# Patient Record
Sex: Male | Born: 1955 | Race: Black or African American | Hispanic: No | Marital: Single
Health system: Southern US, Community
[De-identification: ages and names within clinical notes are randomized; demographics above are authoritative.]

---

## 2004-02-23 ENCOUNTER — Emergency Department (HOSPITAL_COMMUNITY): Admission: EM | Admit: 2004-02-23 | Discharge: 2004-02-23 | Payer: Self-pay | Admitting: Emergency Medicine

## 2004-02-24 ENCOUNTER — Emergency Department (HOSPITAL_COMMUNITY): Admission: EM | Admit: 2004-02-24 | Discharge: 2004-02-24 | Payer: Self-pay | Admitting: Emergency Medicine

## 2004-08-15 ENCOUNTER — Emergency Department (HOSPITAL_COMMUNITY): Admission: EM | Admit: 2004-08-15 | Discharge: 2004-08-15 | Payer: Self-pay | Admitting: Emergency Medicine

## 2004-10-23 ENCOUNTER — Emergency Department (HOSPITAL_COMMUNITY): Admission: EM | Admit: 2004-10-23 | Discharge: 2004-10-23 | Payer: Self-pay | Admitting: Emergency Medicine

## 2005-06-22 ENCOUNTER — Emergency Department (HOSPITAL_COMMUNITY): Admission: EM | Admit: 2005-06-22 | Discharge: 2005-06-22 | Payer: Self-pay | Admitting: Emergency Medicine

## 2005-07-30 ENCOUNTER — Inpatient Hospital Stay (HOSPITAL_COMMUNITY): Admission: EM | Admit: 2005-07-30 | Discharge: 2005-08-01 | Payer: Self-pay | Admitting: Internal Medicine

## 2005-07-30 ENCOUNTER — Encounter: Payer: Self-pay | Admitting: Emergency Medicine

## 2006-04-28 ENCOUNTER — Emergency Department (HOSPITAL_COMMUNITY): Admission: EM | Admit: 2006-04-28 | Discharge: 2006-04-28 | Payer: Self-pay | Admitting: Emergency Medicine

## 2006-05-19 ENCOUNTER — Emergency Department (HOSPITAL_COMMUNITY): Admission: EM | Admit: 2006-05-19 | Discharge: 2006-05-19 | Payer: Self-pay | Admitting: Emergency Medicine

## 2006-08-14 ENCOUNTER — Emergency Department (HOSPITAL_COMMUNITY): Admission: EM | Admit: 2006-08-14 | Discharge: 2006-08-14 | Payer: Self-pay | Admitting: Emergency Medicine

## 2006-11-10 ENCOUNTER — Emergency Department (HOSPITAL_COMMUNITY): Admission: EM | Admit: 2006-11-10 | Discharge: 2006-11-10 | Payer: Self-pay | Admitting: Emergency Medicine

## 2007-04-12 ENCOUNTER — Emergency Department (HOSPITAL_COMMUNITY): Admission: EM | Admit: 2007-04-12 | Discharge: 2007-04-12 | Payer: Self-pay | Admitting: Emergency Medicine

## 2008-02-27 ENCOUNTER — Emergency Department (HOSPITAL_COMMUNITY): Admission: EM | Admit: 2008-02-27 | Discharge: 2008-02-27 | Payer: Self-pay | Admitting: Emergency Medicine

## 2008-04-07 ENCOUNTER — Emergency Department (HOSPITAL_COMMUNITY): Admission: EM | Admit: 2008-04-07 | Discharge: 2008-04-07 | Payer: Self-pay | Admitting: *Deleted

## 2008-07-07 ENCOUNTER — Encounter: Admission: RE | Admit: 2008-07-07 | Discharge: 2008-07-07 | Payer: Self-pay | Admitting: Chiropractic Medicine

## 2008-07-25 ENCOUNTER — Emergency Department (HOSPITAL_COMMUNITY): Admission: EM | Admit: 2008-07-25 | Discharge: 2008-07-25 | Payer: Self-pay | Admitting: Emergency Medicine

## 2008-11-13 ENCOUNTER — Emergency Department (HOSPITAL_COMMUNITY): Admission: EM | Admit: 2008-11-13 | Discharge: 2008-11-13 | Payer: Self-pay | Admitting: Emergency Medicine

## 2008-11-14 ENCOUNTER — Emergency Department (HOSPITAL_COMMUNITY): Admission: EM | Admit: 2008-11-14 | Discharge: 2008-11-14 | Payer: Self-pay | Admitting: Emergency Medicine

## 2009-02-24 ENCOUNTER — Emergency Department (HOSPITAL_COMMUNITY): Admission: EM | Admit: 2009-02-24 | Discharge: 2009-02-24 | Payer: Self-pay | Admitting: Emergency Medicine

## 2009-03-29 ENCOUNTER — Emergency Department (HOSPITAL_COMMUNITY): Admission: EM | Admit: 2009-03-29 | Discharge: 2009-03-29 | Payer: Self-pay | Admitting: Emergency Medicine

## 2009-06-05 ENCOUNTER — Emergency Department (HOSPITAL_COMMUNITY): Admission: EM | Admit: 2009-06-05 | Discharge: 2009-06-05 | Payer: Self-pay | Admitting: Emergency Medicine

## 2009-06-10 ENCOUNTER — Emergency Department (HOSPITAL_COMMUNITY): Admission: EM | Admit: 2009-06-10 | Discharge: 2009-06-11 | Payer: Self-pay | Admitting: Emergency Medicine

## 2009-06-11 ENCOUNTER — Emergency Department (HOSPITAL_COMMUNITY): Admission: EM | Admit: 2009-06-11 | Discharge: 2009-06-11 | Payer: Self-pay | Admitting: Emergency Medicine

## 2009-06-12 ENCOUNTER — Emergency Department (HOSPITAL_COMMUNITY): Admission: EM | Admit: 2009-06-12 | Discharge: 2009-06-12 | Payer: Self-pay | Admitting: Emergency Medicine

## 2009-06-14 ENCOUNTER — Emergency Department (HOSPITAL_COMMUNITY): Admission: EM | Admit: 2009-06-14 | Discharge: 2009-06-14 | Payer: Self-pay | Admitting: Emergency Medicine

## 2009-07-08 ENCOUNTER — Emergency Department (HOSPITAL_COMMUNITY): Admission: EM | Admit: 2009-07-08 | Discharge: 2009-07-08 | Payer: Self-pay | Admitting: Emergency Medicine

## 2009-07-10 ENCOUNTER — Emergency Department (HOSPITAL_COMMUNITY): Admission: EM | Admit: 2009-07-10 | Discharge: 2009-07-10 | Payer: Self-pay | Admitting: Emergency Medicine

## 2009-08-16 ENCOUNTER — Emergency Department (HOSPITAL_COMMUNITY): Admission: EM | Admit: 2009-08-16 | Discharge: 2009-08-16 | Payer: Self-pay | Admitting: Emergency Medicine

## 2009-10-04 ENCOUNTER — Emergency Department (HOSPITAL_COMMUNITY): Admission: EM | Admit: 2009-10-04 | Discharge: 2009-10-04 | Payer: Self-pay | Admitting: Emergency Medicine

## 2010-01-03 ENCOUNTER — Emergency Department (HOSPITAL_COMMUNITY): Admission: EM | Admit: 2010-01-03 | Discharge: 2010-01-04 | Payer: Self-pay | Admitting: Emergency Medicine
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
## 2021-12-01 ENCOUNTER — Emergency Department (HOSPITAL_COMMUNITY): Payer: Medicare HMO

## 2021-12-01 ENCOUNTER — Emergency Department (HOSPITAL_COMMUNITY)
Admission: EM | Admit: 2021-12-01 | Discharge: 2021-12-02 | Discharge status: Against Medical Advice | Payer: Medicare HMO | Attending: Emergency Medicine | Admitting: Emergency Medicine

## 2021-12-01 DIAGNOSIS — J45909 Unspecified asthma, uncomplicated: Secondary | ICD-10-CM | POA: Diagnosis not present

## 2021-12-01 DIAGNOSIS — Z5321 Procedure and treatment not carried out due to patient leaving prior to being seen by health care provider: Secondary | ICD-10-CM | POA: Diagnosis not present

## 2021-12-01 DIAGNOSIS — R0602 Shortness of breath: Secondary | ICD-10-CM | POA: Diagnosis present

## 2021-12-01 MED ORDER — ALBUTEROL SULFATE HFA 108 (90 BASE) MCG/ACT IN AERS
2.0000 | INHALATION_SPRAY | RESPIRATORY_TRACT | Status: DC | PRN
  Administered 2021-12-02: 2 via RESPIRATORY_TRACT
  Filled 2021-12-01: qty 6.7

## 2021-12-01 NOTE — ED Triage Notes (Signed)
Pt c/o SHOB x2 days, states he doesn't have an inhaler. States he has "bad" respiratory hx- asthma. States he gets "clogged up real bad sometimes."  Pt speaking in complete sentences, no distress noted at this time.

## 2021-12-02 DIAGNOSIS — R0602 Shortness of breath: Secondary | ICD-10-CM | POA: Diagnosis not present

## 2021-12-02 NOTE — ED Notes (Signed)
Patient upset over wait time. States "I just need an inhaler can't I just see the doctor?" Explained triage and rooming process. States "let me get the f*ck out of here then."

## 2021-12-02 NOTE — ED Notes (Signed)
Patient given inhaler by triage RN. Patient still complaining about not getting a bus pass. Explained he would have to be discharged by a provider before we could give him one. Patient verbally aggressive towards this Radio producer. Patient left
